# Patient Record
Sex: Female | Born: 1970 | Race: White | Hispanic: No | Marital: Single | Smoking: Never smoker
Health system: Southern US, Community
[De-identification: ages and names within clinical notes are randomized; demographics above are authoritative.]

## PROBLEM LIST (undated history)

## (undated) DIAGNOSIS — Q210 Ventricular septal defect: Secondary | ICD-10-CM

## (undated) DIAGNOSIS — I4891 Unspecified atrial fibrillation: Secondary | ICD-10-CM

## (undated) DIAGNOSIS — I639 Cerebral infarction, unspecified: Secondary | ICD-10-CM

## (undated) DIAGNOSIS — C4490 Unspecified malignant neoplasm of skin, unspecified: Secondary | ICD-10-CM

## (undated) HISTORY — PX: CARDIAC SURGERY: SHX584

## (undated) HISTORY — PX: CHOLECYSTECTOMY: SHX55

## (undated) HISTORY — PX: ABLATION: SHX5711

---

## 2010-12-04 ENCOUNTER — Ambulatory Visit: Payer: Self-pay | Admitting: Internal Medicine

## 2012-12-11 ENCOUNTER — Emergency Department: Payer: Self-pay | Admitting: Emergency Medicine

## 2012-12-11 LAB — COMPREHENSIVE METABOLIC PANEL
Albumin: 3.9 g/dL (ref 3.4–5.0)
Alkaline Phosphatase: 128 U/L (ref 50–136)
Anion Gap: 7 (ref 7–16)
Bilirubin,Total: 0.4 mg/dL (ref 0.2–1.0)
Calcium, Total: 9 mg/dL (ref 8.5–10.1)
Co2: 26 mmol/L (ref 21–32)
EGFR (African American): >60
EGFR (Non-African Amer.): >60
Osmolality: 273 (ref 275–301)
Potassium: 3.7 mmol/L (ref 3.5–5.1)
SGPT (ALT): 29 U/L (ref 12–78)

## 2012-12-11 LAB — URINALYSIS, COMPLETE
Bilirubin,UR: NEGATIVE
Blood: NEGATIVE
Glucose,UR: NEGATIVE mg/dL (ref 0–75)
Leukocyte Esterase: NEGATIVE
Protein: NEGATIVE
Specific Gravity: 1.01 (ref 1.003–1.030)
Squamous Epithelial: 2
WBC UR: 1 /HPF (ref 0–5)

## 2012-12-11 LAB — MAGNESIUM: Magnesium: 1.9 mg/dL

## 2012-12-11 LAB — CBC
HGB: 13.8 g/dL (ref 12.0–16.0)
MCH: 28.8 pg (ref 26.0–34.0)
MCHC: 33.6 g/dL (ref 32.0–36.0)
MCV: 86 fL (ref 80–100)
WBC: 11.1 10*3/uL — ABNORMAL HIGH (ref 3.6–11.0)

## 2016-03-22 ENCOUNTER — Ambulatory Visit
Admission: EM | Admit: 2016-03-22 | Discharge: 2016-03-22 | Disposition: A | Discharge status: Home / Self Care | Payer: BC Managed Care – PPO | Attending: Family Medicine | Admitting: Family Medicine

## 2016-03-22 DIAGNOSIS — K0889 Other specified disorders of teeth and supporting structures: Secondary | ICD-10-CM | POA: Diagnosis not present

## 2016-03-22 HISTORY — DX: Cerebral infarction, unspecified: I63.9

## 2016-03-22 HISTORY — DX: Unspecified atrial fibrillation: I48.91

## 2016-03-22 HISTORY — DX: Unspecified malignant neoplasm of skin, unspecified: C44.90

## 2016-03-22 MED ORDER — HYDROCODONE-ACETAMINOPHEN 5-325 MG PO TABS: 1.0000 | ORAL_TABLET | Freq: Three times a day (TID) | ORAL | Status: DC | PRN

## 2016-03-22 MED ORDER — AMOXICILLIN-POT CLAVULANATE 875-125 MG PO TABS: 1.0000 | ORAL_TABLET | Freq: Two times a day (BID) | ORAL | Status: DC

## 2016-03-22 NOTE — ED Provider Notes (Signed)
CSN: [id number]     Arrival date & time 03/22/16  1343 History   First MD Initiated Contact with Patient 03/22/16 1355     Chief Complaint  Patient presents with  . Dental Problem    Pt reports she broke off a tooth last night upper left mouth.  Pain 3/10. Wants ABX due to being a heart patient.    (Consider location/radiation/quality/duration/timing/severity/associated sxs/prior Treatment) HPI: Patient states that she bit into a piece of gum yesterday and chipped her left upper tooth. She has noticed some swelling and pain increasing over the day. She had open heart surgery as a child and has atrial fibrillation. She is on a blood thinner. She plans to see her dentist early in the week. Patient has taken amoxicillin/Augmentin in the past without any problems. Patient also admits to taking Vicodin in the past without any problems. She states that codeine has in the past made her nauseous.  No past medical history on file. No past surgical history on file. No family history on file. Social History  Substance Use Topics  . Smoking status: Not on file  . Smokeless tobacco: Not on file  . Alcohol Use: Not on file   OB History    No data available     Review of Systems: Negative except mentioned above.   Allergies  Sulfa antibiotics; Ceftazidime; and Codeine  Home Medications   Prior to Admission medications   Medication Sig Start Date End Date Taking? Authorizing Provider  apixaban (ELIQUIS) 5 MG TABS tablet Take 5 mg by mouth 2 (two) times daily.   Yes Historical Provider, MD  furosemide (LASIX) 20 MG tablet Take 20 mg by mouth.   Yes Historical Provider, MD   Meds Ordered and Administered this Visit  Medications - No data to display  BP 103/50 mmHg  Pulse 69  Temp(Src) 98.1 F (36.7 C) (Oral)  Resp 18  Ht 5\' 4"  (1.626 m)  Wt 143 lb (64.864 kg)  BMI 24.53 kg/m2  SpO2 100% No data found.   Physical Exam   GENERAL: NAD HEENT: no pharyngeal erythema, no exudate, small  chip to left upper tooth, no bleeding, mild tenderness along left maxillary area, no significant swelling  RESP: CTA B CARD: RRR NEURO: CN II-XII grossly intact   ED Course  Procedures (including critical care time)  Labs Review Labs Reviewed - No data to display  Imaging Review No results found.     MDM  A/P: Left upper dental pain- will treat with Augmentin, Norco when necessary, soft foods avoid excessive chewing, follow-up with dentist come Monday.   Paulina Fusi, MD 03/22/16 1416

## 2016-05-07 ENCOUNTER — Ambulatory Visit
Admission: EM | Admit: 2016-05-07 | Discharge: 2016-05-07 | Disposition: A | Discharge status: Home / Self Care | Payer: BC Managed Care – PPO | Attending: Emergency Medicine | Admitting: Emergency Medicine

## 2016-05-07 ENCOUNTER — Encounter: Payer: Self-pay | Admitting: Emergency Medicine

## 2016-05-07 DIAGNOSIS — S01512A Laceration without foreign body of oral cavity, initial encounter: Secondary | ICD-10-CM | POA: Diagnosis not present

## 2016-05-07 HISTORY — DX: Ventricular septal defect: Q21.0

## 2016-05-07 MED ORDER — AMOXICILLIN 875 MG PO TABS: 875.0000 mg | ORAL_TABLET | Freq: Two times a day (BID) | ORAL | 0 refills | Status: AC

## 2016-05-07 NOTE — ED Triage Notes (Signed)
Pt was eating and felt something in her mouth and then was bleeding and now maybe a clot in her mouth. She is a heart patient and concerned, pt on blood thinner.

## 2016-05-07 NOTE — Discharge Instructions (Signed)
Avoid hard crunchy foods; listerine mouthwash TID after meals;

## 2016-05-07 NOTE — ED Provider Notes (Signed)
CSN: [id number]     Arrival date & time 05/07/16  1834 History   None    Chief Complaint  Patient presents with  . Laceration   (Consider location/radiation/quality/duration/timing/severity/associated sxs/prior Treatment) Married caucasian female here for evaluation of bleeding in mouth started at restaurant eating pizza and having a beer.  Denied trauma.  Stated crust hard on pizza and she is on a blood thinner for a heart condition.  Couldn't get bleeding under control at restaurant so came here for evaluation.  Concerned it is a blood clot had a stroke one year ago.      Past Medical History:  Diagnosis Date  . Atrial fibrillation ([place])   . CVA (cerebral infarction)   . Skin cancer   . VSD (ventricular septal defect)    Past Surgical History:  Procedure Laterality Date  . ABLATION    . CARDIAC SURGERY    . CHOLECYSTECTOMY     History reviewed. No pertinent family history. Social History  Substance Use Topics  . Smoking status: Never Smoker  . Smokeless tobacco: Never Used  . Alcohol use No   OB History    No data available     Review of Systems  Constitutional: Negative for chills and fever.  HENT: Negative for congestion, ear pain and sore throat.   Eyes: Negative for pain and discharge.  Respiratory: Negative for cough and wheezing.   Cardiovascular: Negative for chest pain and leg swelling.  Gastrointestinal: Negative for blood in stool, constipation, diarrhea, nausea and vomiting.  Genitourinary: Negative for difficulty urinating, dysuria and hematuria.  Musculoskeletal: Negative for arthralgias, back pain and myalgias.  Skin: Positive for wound. Negative for color change, pallor and rash.  Allergic/Immunologic: Negative for environmental allergies and food allergies.  Neurological: Negative for headaches.  Hematological: Negative for adenopathy. Does not bruise/bleed easily.  Psychiatric/Behavioral: Negative for agitation and confusion. The patient is not  nervous/anxious.     Allergies  Sulfa antibiotics; Ceftazidime; and Codeine  Home Medications   Prior to Admission medications   Medication Sig Start Date End Date Taking? Authorizing Provider  metoprolol tartrate (LOPRESSOR) 25 MG tablet Take 25 mg by mouth as needed (for heart palpitations).   Yes Historical Provider, MD  amoxicillin (AMOXIL) 875 MG tablet Take 1 tablet (875 mg total) by mouth 2 (two) times daily. 05/07/16 05/14/16  Olen Cordial, NP  apixaban (ELIQUIS) 5 MG TABS tablet Take 5 mg by mouth 2 (two) times daily.    Historical Provider, MD  furosemide (LASIX) 20 MG tablet Take 20 mg by mouth as needed.     Historical Provider, MD   Meds Ordered and Administered this Visit  Medications - No data to display  BP (!) 108/57 (BP Location: Left Arm)   Pulse 77   Temp 98.1 F (36.7 C) (Oral)   Resp 16   Ht 5\' 4"  (1.626 m)   Wt 144 lb (65.3 kg)   LMP 03/07/2014   SpO2 100%   BMI 24.72 kg/m  No data found.   Physical Exam  Constitutional: She is oriented to person, place, and time. Vital signs are normal. She appears well-developed and well-nourished. She is cooperative.  Non-toxic appearance. She does not have a sickly appearance. She does not appear ill. No distress.  HENT:  Head: Normocephalic and atraumatic.  Right Ear: Hearing and external ear normal.  Left Ear: Hearing and external ear normal.  Nose: Nose normal.  Mouth/Throat: Uvula is midline and oropharynx is clear and moist.  Mucous membranes are not pale, not dry and not cyanotic. She does not have dentures. No oral lesions. No trismus in the jaw. Normal dentition. Lacerations present. No dental abscesses, uvula swelling or dental caries. No oropharyngeal exudate, posterior oropharyngeal edema, posterior oropharyngeal erythema or tonsillar abscesses. Tonsils are 0 on the right. Tonsils are 0 on the left. No tonsillar exudate.    Eyes: Conjunctivae, EOM and lids are normal. Pupils are equal, round, and  reactive to light. Right eye exhibits no discharge. Left eye exhibits no discharge. No scleral icterus.  Neck: Trachea normal and normal range of motion. Neck supple.  Cardiovascular: Normal rate, regular rhythm, normal heart sounds and intact distal pulses.   No murmur heard. Pulmonary/Chest: Effort normal and breath sounds normal. No respiratory distress.  Abdominal: Soft. Bowel sounds are normal. There is no tenderness.  Musculoskeletal: Normal range of motion. She exhibits no edema, tenderness or deformity.  Lymphadenopathy:    She has no cervical adenopathy.  Neurological: She is alert and oriented to person, place, and time. She displays normal reflexes. She exhibits normal muscle tone. Coordination normal.  Skin: Skin is warm, dry and intact. Capillary refill takes less than 2 seconds. No rash noted. No erythema. No pallor.  Psychiatric: She has a normal mood and affect. Her speech is normal and behavior is normal. Judgment and thought content normal. Cognition and memory are normal.  Nursing note and vitals reviewed.   Urgent Care Course   Clinical Course    Procedures (including critical care time)  Labs Review Labs Reviewed - No data to display  Imaging Review No results found.     MDM   1. Laceration of buccal mucosa without complication, initial encounter    Soft diet until affected area healed avoid crispy/crunchy/hard foods discussed pasta/eggs/mashed potatoes/ice cream/pudding/jello.  If bleeding restarts direct pressure/ice to area and if no resolution in 15 minutes or less and/or hematoma area expands to diameter greater than 3cm/dysphagia/dyspnea/dysphasia seek care again PCM/[facility] or ER of choice for re-evaluation.  Good oral hygiene to prevent infection in lacerated area right buccal area.  Rx given amoxicillin 875mg  po BID x 7 days if signs of infection noted hot swelling/edema/erythema/fever/pain noted.  Follow up with PCM/dentist if no improvement or worsening  of symptoms e.g. fever, chills, dyspnea, SOB, purulent discharge, neck pain, eye pain.  Patient verbalized understanding of information/instructions agreed with plan of care and had no further questions at this time.      Olen Cordial, NP 05/07/16 2044

## 2017-10-06 ENCOUNTER — Ambulatory Visit
Admission: EM | Admit: 2017-10-06 | Discharge: 2017-10-06 | Disposition: A | Discharge status: Home / Self Care | Payer: BC Managed Care – PPO | Attending: Family Medicine | Admitting: Family Medicine

## 2017-10-06 ENCOUNTER — Other Ambulatory Visit: Payer: Self-pay

## 2017-10-06 DIAGNOSIS — J0100 Acute maxillary sinusitis, unspecified: Secondary | ICD-10-CM | POA: Diagnosis not present

## 2017-10-06 MED ORDER — AMOXICILLIN-POT CLAVULANATE 875-125 MG PO TABS: 1.0000 | ORAL_TABLET | Freq: Two times a day (BID) | ORAL | 0 refills | Status: DC

## 2017-10-06 NOTE — ED Provider Notes (Addendum)
[facility]    CSN: [id number] Arrival date & time: 10/06/17  0858     History   Chief Complaint Chief Complaint  Patient presents with  . Sinusitis    HPI Kaitlyn Figueroa is a 47 y.o. female presented to clinic with CC of facial pain and pressure, purulent nasal drainage x 2 weeks.  Denies fever/chills.  HPI  Past Medical History:  Diagnosis Date  . Atrial fibrillation ([place])   . CVA (cerebral infarction)   . Skin cancer   . VSD (ventricular septal defect)     There are no active problems to display for this patient.   Past Surgical History:  Procedure Laterality Date  . ABLATION    . CARDIAC SURGERY    . CHOLECYSTECTOMY      OB History    No data available       Home Medications    Prior to Admission medications   Medication Sig Start Date End Date Taking? Authorizing Provider  apixaban (ELIQUIS) 5 MG TABS tablet Take 5 mg by mouth 2 (two) times daily.   Yes [provider]  furosemide (LASIX) 20 MG tablet Take 20 mg by mouth as needed.    Yes [provider]  metoprolol tartrate (LOPRESSOR) 25 MG tablet Take 25 mg by mouth as needed (for heart palpitations).   Yes [provider]  amoxicillin-clavulanate (AUGMENTIN) 875-125 MG tablet Take 1 tablet by mouth every 12 (twelve) hours. 10/06/17   Nick Stults, NP    Family History Family History  Problem Relation Age of Onset  . Heart disease Mother   . Atrial fibrillation Mother   . Leukemia Father   . COPD Father     Social History Social History   Tobacco Use  . Smoking status: Never Smoker  . Smokeless tobacco: Never Used  Substance Use Topics  . Alcohol use: No  . Drug use: No     Allergies   Sulfa antibiotics; Ceftazidime; and Codeine   Review of Systems Review of Systems  Constitutional: Negative.   HENT: Positive for congestion, postnasal drip, sinus pressure and sinus pain.   Cardiovascular: Negative.      Physical Exam Triage Vital  Signs ED Triage Vitals  Enc Vitals Group     BP 10/06/17 0917 (!) 105/47     Pulse Rate 10/06/17 0917 68     Resp 10/06/17 0917 17     Temp 10/06/17 0917 97.8 F (36.6 C)     Temp Source 10/06/17 0917 Oral     SpO2 10/06/17 0917 99 %     Weight 10/06/17 0914 158 lb (71.7 kg)     Height 10/06/17 0914 5\' 4"  (1.626 m)     Head Circumference --      Peak Flow --      Pain Score 10/06/17 0914 2     Pain Loc --      Pain Edu? --      Excl. in [place]? --    No data found.  Updated Vital Signs BP (!) 105/47 (BP Location: Left Arm)   Pulse 68   Temp 97.8 F (36.6 C) (Oral)   Resp 17   Ht 5\' 4"  (1.626 m)   Wt 158 lb (71.7 kg)   LMP 11/22/2014   SpO2 99%   BMI 27.12 kg/m   Visual Acuity Right Eye Distance:   Left Eye Distance:   Bilateral Distance:    Right Eye Near:   Left Eye Near:  Bilateral Near:     Physical Exam  Constitutional: She is oriented to person, place, and time. She appears well-developed and well-nourished.  HENT:  Nose: Sinus tenderness (Maxillary sinus tenderness to palpation) present. Right sinus exhibits maxillary sinus tenderness. Left sinus exhibits maxillary sinus tenderness.  Eyes: Pupils are equal, round, and reactive to light.  Cardiovascular: Normal rate, regular rhythm and normal heart sounds.  Pulmonary/Chest: Effort normal and breath sounds normal. No respiratory distress. She has no wheezes.  Neurological: She is alert and oriented to person, place, and time.  Skin: Skin is warm.     UC Treatments / Results  Labs (all labs ordered are listed, but only abnormal results are displayed) Labs Reviewed - No data to display  EKG  EKG Interpretation None       Radiology No results found.  Procedures Procedures (including critical care time)  Medications Ordered in UC Medications - No data to display   Initial Impression / Assessment and Plan / UC Course  I have reviewed the triage vital signs and the nursing notes.  Pertinent  labs & imaging results that were available during my care of the patient were reviewed by me and considered in my medical decision making (see chart for details).    Saline nasal rinse as advised  Drug Allergies verified with patient. Pt have taken Augmentin previously without any adverse reaction.  Final Clinical Impressions(s) / UC Diagnoses   Final diagnoses:  Acute maxillary sinusitis, recurrence not specified    ED Discharge Orders        Ordered    amoxicillin-clavulanate (AUGMENTIN) 875-125 MG tablet  Every 12 hours     10/06/17 0946       Controlled Substance Prescriptions [place] Controlled Substance Registry consulted? Not Applicable   Teola Bradley, NP 10/06/17 Hunter, Burns, NP 10/06/17 [phone]

## 2017-10-06 NOTE — ED Triage Notes (Signed)
Patient complains of cough, congestion, sinus pain and pressure x 2 weeks. Patient states that she is currently a heart patient and is not able to take OTC medication. Patient states that she has noticed some labored breathing at times.

## 2017-10-06 NOTE — Discharge Instructions (Signed)
Saline nasal rinse as advised. Tylenol as needed for pain.

## 2018-07-25 ENCOUNTER — Ambulatory Visit
Admission: EM | Admit: 2018-07-25 | Discharge: 2018-07-25 | Disposition: A | Discharge status: Home / Self Care | Payer: BC Managed Care – PPO | Attending: Emergency Medicine | Admitting: Emergency Medicine

## 2018-07-25 DIAGNOSIS — T7840XA Allergy, unspecified, initial encounter: Secondary | ICD-10-CM | POA: Diagnosis not present

## 2018-07-25 MED ORDER — ONDANSETRON 8 MG PO TBDP: 8.0000 mg | ORAL_TABLET | Freq: Two times a day (BID) | ORAL | 0 refills | Status: DC

## 2018-07-25 NOTE — ED Provider Notes (Signed)
[facility]    CSN: [id number] Arrival date & time: 07/25/18  1223     History   Chief Complaint Chief Complaint  Patient presents with  . Allergic Reaction    HPI Elira Colasanti is a 47 y.o. female.   HPI  47 year old female presents stating that on Wednesday 4 days prior to this visit her flu shot in her left upper arm.  Shortly afterwards she started having tingling in her arm swelled up.  States that she had small whelps here on her body.  Since then she is been having nausea diarrhea and a feeling of her body aches and pains in her legs.  Has taken 1 g of Tylenol without relief.  That yesterday the diarrhea was worse than today it is much better today but still does not have complete form.  Is any fever or chills.  She has not had any further whelps.  She has no itching.  Had any respiratory symptoms.  She is able to tolerate food and liquids but does not have much of an appetite.     Past Medical History:  Diagnosis Date  . Atrial fibrillation ([place])   . CVA (cerebral infarction)   . Skin cancer   . VSD (ventricular septal defect)     There are no active problems to display for this patient.   Past Surgical History:  Procedure Laterality Date  . ABLATION    . CARDIAC SURGERY    . CHOLECYSTECTOMY      OB History   None      Home Medications    Prior to Admission medications   Medication Sig Start Date End Date Taking? Authorizing Provider  apixaban (ELIQUIS) 5 MG TABS tablet Take 5 mg by mouth 2 (two) times daily.   Yes [provider]  furosemide (LASIX) 20 MG tablet Take 20 mg by mouth as needed.    Yes [provider]  metoprolol tartrate (LOPRESSOR) 25 MG tablet Take 25 mg by mouth as needed (for heart palpitations).   Yes [provider]  amoxicillin-clavulanate (AUGMENTIN) 875-125 MG tablet Take 1 tablet by mouth every 12 (twelve) hours. 10/06/17   Multani, Bhupinder, NP  ondansetron (ZOFRAN ODT) 8 MG  disintegrating tablet Take 1 tablet (8 mg total) by mouth 2 (two) times daily. 07/25/18   Lorin Picket, PA-C    Family History Family History  Problem Relation Age of Onset  . Heart disease Mother   . Atrial fibrillation Mother   . Leukemia Father   . COPD Father     Social History Social History   Tobacco Use  . Smoking status: Never Smoker  . Smokeless tobacco: Never Used  Substance Use Topics  . Alcohol use: No  . Drug use: No     Allergies   Sulfa antibiotics; Ceftazidime; and Codeine   Review of Systems Review of Systems  Constitutional: Positive for activity change. Negative for appetite change, chills, fatigue and fever.  Gastrointestinal: Positive for diarrhea and nausea. Negative for abdominal distention, abdominal pain, blood in stool, constipation and vomiting.  Skin: Positive for color change.  All other systems reviewed and are negative.    Physical Exam Triage Vital Signs ED Triage Vitals  Enc Vitals Group     BP 07/25/18 1244 99/65     Pulse Rate 07/25/18 1244 82     Resp 07/25/18 1244 18     Temp 07/25/18 1244 98.5 F (36.9 C)     Temp Source  07/25/18 1244 Oral     SpO2 07/25/18 1244 99 %     Weight 07/25/18 1247 160 lb (72.6 kg)     Height --      Head Circumference --      Peak Flow --      Pain Score 07/25/18 1246 2     Pain Loc --      Pain Edu? --      Excl. in [place]? --    No data found.  Updated Vital Signs BP 99/65 (BP Location: Right Arm)   Pulse 82   Temp 98.5 F (36.9 C) (Oral)   Resp 18   Wt 160 lb (72.6 kg)   LMP 11/22/2014   SpO2 99%   BMI 27.46 kg/m   Visual Acuity Right Eye Distance:   Left Eye Distance:   Bilateral Distance:    Right Eye Near:   Left Eye Near:    Bilateral Near:     Physical Exam  Constitutional: She is oriented to person, place, and time. She appears well-developed and well-nourished. No distress.  HENT:  Head: Normocephalic.  Eyes: Pupils are equal, round, and reactive to light.  Right eye exhibits no discharge. Left eye exhibits no discharge.  Neck: Normal range of motion.  Abdominal: Soft. Bowel sounds are normal. She exhibits no distension. There is no tenderness. There is no rebound.  Musculoskeletal: Normal range of motion.  Neurological: She is alert and oriented to person, place, and time.  Skin: Skin is warm and dry. She is not diaphoretic.  There is a bruise that is resolving inferior to the deltoid on the lateral posterior surface of the left upper arm.  Psychiatric: She has a normal mood and affect. Her behavior is normal. Judgment and thought content normal.  Nursing note and vitals reviewed.    UC Treatments / Results  Labs (all labs ordered are listed, but only abnormal results are displayed) Labs Reviewed - No data to display  EKG None  Radiology No results found.  Procedures Procedures (including critical care time)  Medications Ordered in UC Medications - No data to display  Initial Impression / Assessment and Plan / UC Course  I have reviewed the triage vital signs and the nursing notes.  Pertinent labs & imaging results that were available during my care of the patient were reviewed by me and considered in my medical decision making (see chart for details).   Discussion with the patient regarding her symptoms.  She likely had a mild reaction to the influenza injection locally and perhaps systemically.  Reassured her that this seemed  extremely mild and resolving.  The diarrhea seems to be lessening.  He  has nausea and I will prescribe Zofran for that to help her.  He can apply heat to the bruised area on her arm from the injection.  Otherwise I expect her to improve. If She does not she may return to our clinic or go to a primary care physician.     Final Clinical Impressions(s) / UC Diagnoses   Final diagnoses:  Allergic reaction to drug, initial encounter     Discharge Instructions     May apply heat to the injection area  to help with the bruising.  Continue taking Tylenol 1 g every 6 hours as necessary for pain /fatigue.  Do not take more than 4 g/day.  You are not improving follow-up with a primary care physician   ED Prescriptions    Medication Sig Dispense  Auth. Provider   ondansetron (ZOFRAN ODT) 8 MG disintegrating tablet Take 1 tablet (8 mg total) by mouth 2 (two) times daily. 6 tablet Lorin Picket, PA-C     Controlled Substance Prescriptions [place] Controlled Substance Registry consulted? Not Applicable   Lorin Picket, PA-C 07/25/18 1414

## 2018-07-25 NOTE — Discharge Instructions (Signed)
May apply heat to the injection area to help with the bruising.  Continue taking Tylenol 1 g every 6 hours as necessary for pain /fatigue.  Do not take more than 4 g/day.  You are not improving follow-up with a primary care physician

## 2018-07-25 NOTE — ED Triage Notes (Signed)
Pt states she had a flu shot on Wednesday and states she had a allergic reaction where her lips started tingling and arm swelled up. But states since then she has been having nausea, diarrhea, feeling bad and body aches/pain in her legs. Did take tylenol without relief.

## 2018-10-07 ENCOUNTER — Ambulatory Visit
Admission: EM | Admit: 2018-10-07 | Discharge: 2018-10-07 | Disposition: A | Discharge status: Home / Self Care | Payer: BC Managed Care – PPO | Attending: Family Medicine | Admitting: Family Medicine

## 2018-10-07 ENCOUNTER — Other Ambulatory Visit: Payer: Self-pay

## 2018-10-07 DIAGNOSIS — H539 Unspecified visual disturbance: Secondary | ICD-10-CM | POA: Diagnosis not present

## 2018-10-07 NOTE — ED Triage Notes (Signed)
Pt states 20 minutes ago she lost peripheral vision in right eye. Now seeing "bright dots" in left eye.

## 2018-10-07 NOTE — ED Provider Notes (Signed)
[facility]    CSN: [id number] Arrival date & time: 10/07/18  1802  History   Chief Complaint Chief Complaint  Patient presents with  . Loss of Vision   HPI  48 year old female presents with changes.  Patient reports that approximate 20 minutes prior to arrival she developed blurry vision, flashes, and floaters.  Started in the right eye.  Has now developed in the left eye as well.  Patient states that she currently feels anxious.  No weakness, paresthesias.  Patient has a history of TIA and stroke.  She is currently on Eliquis.  She endorses compliance.  No reports of facial droop.  No speech difficulty.  Patient feels like her vision is worsening.  No other associated symptoms.  No other complaints or concerns at this time.  PMH, Surgical Hx, Family Hx, Social History reviewed and updated as below.  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  Congenital heart disease  s/p ASD repair age 67 with residual small perimenbranous VSD  VSD (ventricular septal defect), perimembranous    S/P right heart catheterization 2010 normal filling pressures, normal cardiac output, no evidence of intracardiac shunt  Personal history of prior ablation treatment 2008 AVNRT ablation  History of atrial fibrillation    S/P radiofrequency ablation operation for arrhythmia 2015   Abnormal mammogram    Anxiety  Was on Citalopram for a time  Cancer (CMS-HCC) SKIN R ARM  Heart murmur 1992   Stroke (CMS-HCC) 2016 cerebral event  Bilateral carpal tunnel syndrome    Low back strain    Personal history of surgery to heart and great vessels, presenting hazards to health 02/16/2014   History of atrial septal defect repair 08/22/2015   Herpes zoster without complication 9/70/2637    Surgical Hx: ASD REPAIR 10/06/1990 - 10/06/1991  age 53   [place] 10/06/2006 - 10/06/2007     BREAST BIOPSY  10/06/2005 - 10/05/2006 Left benign   PR EPHYS EVAL W/ ABLATION SUPRAVENT ARRHYTHMIA 11/15/2015 N/A Procedure: A-Flutter Ablation; Surgeon: Michelle Piper, MD; Location: [facility] EP; Service: Cardiology     Home Medications    Prior to Admission medications   Medication Sig Start Date End Date Taking? Authorizing Provider  apixaban (ELIQUIS) 5 MG TABS tablet Take 5 mg by mouth 2 (two) times daily.   Yes [provider]  furosemide (LASIX) 20 MG tablet Take 20 mg by mouth as needed.    Yes [provider]  metoprolol tartrate (LOPRESSOR) 25 MG tablet Take 25 mg by mouth as needed (for heart palpitations).   Yes [provider]  ondansetron (ZOFRAN ODT) 8 MG disintegrating tablet Take 1 tablet (8 mg total) by mouth 2 (two) times daily. 07/25/18   Lorin Picket, PA-C    Family History Family History  Problem Relation Age of Onset  . Heart disease Mother   . Atrial fibrillation Mother   . Leukemia Father   . COPD Father     Social History Social History   Tobacco Use  . Smoking status: Never Smoker  . Smokeless tobacco: Never Used  Substance Use Topics  . Alcohol use: No  . Drug use: No     Allergies   Sulfa antibiotics; Ceftazidime; and Codeine   Review of Systems Review of Systems  Eyes: Positive for visual disturbance.  Neurological: Negative.    Physical Exam Triage Vital Signs ED Triage Vitals [10/07/18 1810]  Enc  Vitals Group     BP 136/76     Pulse Rate 79     Resp      Temp 97.6 F (36.4 C)     Temp src      SpO2 100 %     Weight 160 lb (72.6 kg)     Height 5\' 4"  (1.626 m)     Head Circumference      Peak Flow      Pain Score 0     Pain Loc      Pain Edu?      Excl. in [place]?    Updated Vital Signs BP 136/76   Pulse 79   Temp 97.6 F (36.4 C)   Ht 5\' 4"  (1.626 m)   Wt 72.6 kg   LMP 11/22/2014   SpO2 100%   BMI 27.46 kg/m   Visual Acuity Right Eye Distance:   Left Eye Distance:   Bilateral Distance:    Right  Eye Near:   Left Eye Near:    Bilateral Near:     Physical Exam Vitals signs and nursing note reviewed.  Constitutional:      General: She is not in acute distress. HENT:     Head: Normocephalic and atraumatic.     Nose: Nose normal.  Eyes:     Extraocular Movements: Extraocular movements intact.     Conjunctiva/sclera: Conjunctivae normal.     Pupils: Pupils are equal, round, and reactive to light.  Cardiovascular:     Rate and Rhythm: Normal rate and regular rhythm.  Pulmonary:     Effort: Pulmonary effort is normal.     Breath sounds: Normal breath sounds.  Neurological:     Mental Status: She is alert and oriented to person, place, and time.     Comments: No apparent cranial nerve deficits.  Normal muscle strength throughout.  Psychiatric:        Mood and Affect: Mood normal.        Behavior: Behavior normal.    UC Treatments / Results  Labs (all labs ordered are listed, but only abnormal results are displayed) Labs Reviewed - No data to display  EKG None  Radiology No results found.  Procedures Procedures (including critical care time)  Medications Ordered in UC Medications - No data to display  Initial Impression / Assessment and Plan / UC Course  I have reviewed the triage vital signs and the nursing notes.  Pertinent labs & imaging results that were available during my care of the patient were reviewed by me and considered in my medical decision making (see chart for details).    48 year old female presents with flashes, floaters, and blurry vision.  Patient has a history of TIA as well as stroke.  Advised the patient that she needs to go to the hospital for further evaluation and management.  Patient elected to go via EMS.  Final Clinical Impressions(s) / UC Diagnoses   Final diagnoses:  Visual disturbance   Discharge Instructions   None    ED Prescriptions    None     Controlled Substance Prescriptions [place] Controlled Substance Registry  consulted? Not Applicable   Coral Spikes, [place] 10/07/18 Valerie Roys

## 2019-02-05 ENCOUNTER — Ambulatory Visit
Admission: EM | Admit: 2019-02-05 | Discharge: 2019-02-05 | Disposition: A | Discharge status: Nursing Home (Includes ICF) | Payer: BC Managed Care – PPO | Attending: Family Medicine | Admitting: Family Medicine

## 2019-02-05 DIAGNOSIS — R51 Headache: Secondary | ICD-10-CM | POA: Diagnosis not present

## 2019-02-05 DIAGNOSIS — R202 Paresthesia of skin: Secondary | ICD-10-CM | POA: Insufficient documentation

## 2019-02-05 DIAGNOSIS — R519 Headache, unspecified: Secondary | ICD-10-CM

## 2019-02-05 NOTE — ED Provider Notes (Signed)
[facility] ____________________________________________  Time seen: Approximately 12:13 PM  I have reviewed the triage vital signs and the nursing notes.   HISTORY  Chief Complaint Neck Pain   HPI Kaitlyn Figueroa is a 48 y.o. female presenting for evaluation of left arm heaviness and sensation changes.  Also reports left-sided headache.  Patient reports last night while she was sitting on her couch eating ice cream she had sudden onset of entire left arm heaviness and numbness sensation with coldness to her left hand.  States the left arm was aching pain.  States a heating pad did help the area somewhat.  Tylenol did not improve.  Reports she has had intermittent headaches for the last week to the left side, but reports since this morning her left-sided headache has been increased, currently mild to moderate.  States left-sided headache is behind her left eye and describes as a "shoot straight through ".  Denies vision changes.  States she does have some pain in her left shoulder as well that she feels with movement.  Denies decreased range of motion.  Denies history of same.  Reports legs and right arm without changes.  Denies other paresthesias, unsteady gait, dizziness, syncope or near syncope.  Denies chest pain, shortness of breath or palpitations.  History of paroxysmal A. fib, CVA, ASD repair and VSD currently on Eliquis.  Nuys recent sickness or other recent changes.   Past Medical History:  Diagnosis Date  . Atrial fibrillation ([place])   . CVA (cerebral infarction)   . Skin cancer   . VSD (ventricular septal defect)     There are no active problems to display for this patient.   Past Surgical History:  Procedure Laterality Date  . ABLATION    . CARDIAC SURGERY    . CHOLECYSTECTOMY       No current facility-administered medications for this encounter.   Current Outpatient Medications:  .  apixaban (ELIQUIS) 5 MG TABS tablet, Take 5 mg by mouth 2 (two) times  daily., Disp: , Rfl:  .  furosemide (LASIX) 20 MG tablet, Take 20 mg by mouth as needed. , Disp: , Rfl:  .  metoprolol tartrate (LOPRESSOR) 25 MG tablet, Take 25 mg by mouth as needed (for heart palpitations)., Disp: , Rfl:  .  ondansetron (ZOFRAN ODT) 8 MG disintegrating tablet, Take 1 tablet (8 mg total) by mouth 2 (two) times daily., Disp: 6 tablet, Rfl: 0  Allergies Sulfa antibiotics; Ceftazidime; and Codeine  Family History  Problem Relation Age of Onset  . Heart disease Mother   . Atrial fibrillation Mother   . Leukemia Father   . COPD Father     Social History Social History   Tobacco Use  . Smoking status: Never Smoker  . Smokeless tobacco: Never Used  Substance Use Topics  . Alcohol use: No  . Drug use: No    Review of Systems Constitutional: No fever/chills Eyes: No visual changes. ENT: No sore throat. Some post nasal drainage.  Cardiovascular: Denies chest pain. Respiratory: Denies shortness of breath. Musculoskeletal: Negative for back pain. Skin: Negative for rash. Neurological: As above.  ____________________________________________   PHYSICAL EXAM:  VITAL SIGNS: ED Triage Vitals  Enc Vitals Group     BP 02/05/19 1105 (!) 109/54     Pulse Rate 02/05/19 1105 67     Resp 02/05/19 1105 18     Temp 02/05/19 1105 98.1 F (36.7 C)     Temp Source 02/05/19 1105 Oral  SpO2 02/05/19 1105 100 %     Weight 02/05/19 1108 166 lb (75.3 kg)     Height 02/05/19 1108 5\' 4"  (1.626 m)     Head Circumference --      Peak Flow --      Pain Score 02/05/19 1108 2     Pain Loc --      Pain Edu? --      Excl. in [place]? --     Constitutional: Alert and oriented. Well appearing and in no acute distress. Eyes: Conjunctivae are normal. PERRL. EOMI. ENT      Head: Normocephalic and atraumatic.  Mild bilateral maxillary sinus tenderness palpation.      Nose: No congestion/rhinnorhea.      Mouth/Throat: Mucous membranes are moist.Oropharynx non-erythematous. Neck: No  stridor. Supple without meningismus.  Hematological/Lymphatic/Immunilogical: No cervical lymphadenopathy. Cardiovascular: Normal rate, regular rhythm. Grossly normal heart sounds.  Good peripheral circulation. Respiratory: Normal respiratory effort without tachypnea nor retractions. Breath sounds are clear and equal bilaterally. No wheezes, rales, rhonchi. Musculoskeletal: Steady gait.  Bilateral hand grip strong and equal.  Bilateral distal radial pulses equal.  Left trapezius mild tenderness to direct palpation.  Left arm with full range of motion. Neurologic:  Normal speech and language.  No ataxia.  Speech is normal. No gait instability.  No facial asymmetry.  Left upper extremity a few discrepancies in sharp and dull touch but overall sensation intact. Skin:  Skin is warm, dry and intact. No rash noted. Psychiatric: Mood and affect are normal. Speech and behavior are normal. Patient exhibits appropriate insight and judgment   ___________________________________________   LABS (all labs ordered are listed, but only abnormal results are displayed)  Labs Reviewed - No data to display ____________________________________________  EKG  ED ECG REPORT I, Marylene Land, the attending provider and Dr Zenda Alpers, personally viewed and interpreted this ECG.   Date: 02/05/2019  EKG Time: 1138  Rate: 66  Rhythm: Sinus rhythm with first-degree AV block  Axis: normal  Intervals:first-degree A-V block   ST&T Change: no elevation noted.  Similar to ecg 12/04/10   RADIOLOGY  No results found. ____________________________________________   PROCEDURES Procedures     INITIAL IMPRESSION / ASSESSMENT AND PLAN / ED COURSE  Pertinent labs & imaging results that were available during my care of the patient were reviewed by me and considered in my medical decision making (see chart for details).  Patient presenting with left-sided headache and left-sided arm paresthesias and heaviness.   Discussed multiple differentials with patient including TIA, CVA, atypical migraine, musculoskeletal.  Patient with complex medical history including prior CVA.  Recommend further evaluation emergency room at this time, patient agrees to this plan.  Patient verbalized understanding and risk of self transfer to ER, and states that her family will drive her to [facility], declines EMS.  Stable at discharge. ____________________________________________   FINAL CLINICAL IMPRESSION(S) / ED DIAGNOSES  Final diagnoses:  Left-sided headache  Arm paresthesia, left     ED Discharge Orders    None       Note: This dictation was prepared with Dragon dictation along with smaller phrase technology. Any transcriptional errors that result from this process are unintentional.         Marylene Land, NP 02/05/19 1227

## 2019-02-05 NOTE — ED Triage Notes (Signed)
Pt here for neck pain on the left side and numbness and tingling radiating down her left arm into her left hand. States it feels like she pinched a nerve but does have a hx of stroke and is on eliquis. Did take some tylenol and did a heating pad with slight relief. Now reports left hand feeling "cold" and headache behind her left eye and on the left side of her head.

## 2019-02-05 NOTE — Discharge Instructions (Addendum)
Go directly to emergency room as discussed.  °

## 2019-05-20 ENCOUNTER — Encounter: Payer: Self-pay | Admitting: Emergency Medicine

## 2019-05-20 ENCOUNTER — Ambulatory Visit (INDEPENDENT_AMBULATORY_CARE_PROVIDER_SITE_OTHER): Payer: BC Managed Care – PPO

## 2019-05-20 ENCOUNTER — Ambulatory Visit
Admission: EM | Admit: 2019-05-20 | Discharge: 2019-05-20 | Disposition: A | Discharge status: Home / Self Care | Payer: BC Managed Care – PPO | Attending: Family Medicine | Admitting: Family Medicine

## 2019-05-20 ENCOUNTER — Other Ambulatory Visit: Payer: Self-pay

## 2019-05-20 DIAGNOSIS — R079 Chest pain, unspecified: Secondary | ICD-10-CM

## 2019-05-20 DIAGNOSIS — S39012A Strain of muscle, fascia and tendon of lower back, initial encounter: Secondary | ICD-10-CM | POA: Diagnosis not present

## 2019-05-20 DIAGNOSIS — M542 Cervicalgia: Secondary | ICD-10-CM | POA: Diagnosis not present

## 2019-05-20 DIAGNOSIS — S20219A Contusion of unspecified front wall of thorax, initial encounter: Secondary | ICD-10-CM

## 2019-05-20 DIAGNOSIS — M545 Low back pain: Secondary | ICD-10-CM | POA: Diagnosis not present

## 2019-05-20 DIAGNOSIS — M25512 Pain in left shoulder: Secondary | ICD-10-CM

## 2019-05-20 DIAGNOSIS — S161XXA Strain of muscle, fascia and tendon at neck level, initial encounter: Secondary | ICD-10-CM

## 2019-05-20 MED ORDER — CYCLOBENZAPRINE HCL 10 MG PO TABS: 10.0000 mg | ORAL_TABLET | Freq: Every day | ORAL | 0 refills | Status: DC
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
## 2019-05-20 NOTE — Discharge Instructions (Signed)
Rest, ice, heat, tylenol

## 2019-10-19 ENCOUNTER — Other Ambulatory Visit: Payer: Self-pay

## 2019-10-19 ENCOUNTER — Encounter: Payer: Self-pay | Admitting: Emergency Medicine

## 2019-10-19 ENCOUNTER — Ambulatory Visit
Admission: EM | Admit: 2019-10-19 | Discharge: 2019-10-19 | Disposition: A | Discharge status: Home / Self Care | Payer: BC Managed Care – PPO | Attending: Emergency Medicine | Admitting: Emergency Medicine

## 2019-10-19 DIAGNOSIS — R000 Tachycardia, unspecified: Secondary | ICD-10-CM | POA: Diagnosis not present

## 2019-10-19 NOTE — Discharge Instructions (Addendum)
Rest.  Drink plenty of water.  Monitor self closely.  Please follow-up with your cardiologist tomorrow.  If symptoms recur proceed directly to the emergency room tonight.
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
## 2019-10-19 NOTE — ED Triage Notes (Addendum)
Patient has history of a-fib, had atrial septal defect repaired about 30 years ago. Patient states her heart started racing about 45 minutes ago during sexual activity. Denies chest pain, does endorse shortness of breath.

## 2020-04-15 ENCOUNTER — Encounter: Payer: Self-pay | Admitting: Emergency Medicine

## 2020-04-15 ENCOUNTER — Ambulatory Visit
Admission: EM | Admit: 2020-04-15 | Discharge: 2020-04-15 | Disposition: A | Discharge status: Home / Self Care | Payer: BC Managed Care – PPO | Attending: Internal Medicine | Admitting: Internal Medicine

## 2020-04-15 ENCOUNTER — Other Ambulatory Visit: Payer: Self-pay

## 2020-04-15 DIAGNOSIS — M5431 Sciatica, right side: Secondary | ICD-10-CM

## 2020-04-15 MED ORDER — TRAMADOL HCL 50 MG PO TABS: 50.0000 mg | ORAL_TABLET | Freq: Four times a day (QID) | ORAL | 0 refills | Status: AC | PRN

## 2020-04-15 MED ORDER — ACETAMINOPHEN 500 MG PO TABS: 500.0000 mg | ORAL_TABLET | Freq: Four times a day (QID) | ORAL | 0 refills | Status: AC | PRN

## 2020-04-15 MED ORDER — PREDNISONE 20 MG PO TABS: 20.0000 mg | ORAL_TABLET | Freq: Every day | ORAL | 0 refills | Status: AC

## 2020-04-15 NOTE — ED Provider Notes (Signed)
[facility]    CSN: [id number] Arrival date & time: 04/15/20  0801      History   Chief Complaint Chief Complaint  Patient presents with  . Back Pain    HPI Kaitlyn Figueroa is a 49 y.o. female comes to the urgent care with 1 day history of severe right-sided back pain.  Pain started yesterday.  Pain is sharp, severe-currently 10 out of 10.  Pain is aggravated by movement.  Is not relieved by heating pad.  Pain radiates into the right leg.  She denies any numbness, tingling or weakness in the lower extremities.  No trauma or falls.  Patient has a history of ASD associated stroke and is currently on Eliquis.   HPI  Past Medical History:  Diagnosis Date  . Atrial fibrillation ([place])   . CVA (cerebral infarction)   . Skin cancer   . VSD (ventricular septal defect)     There are no problems to display for this patient.   Past Surgical History:  Procedure Laterality Date  . ABLATION    . CARDIAC SURGERY    . CHOLECYSTECTOMY      OB History   No obstetric history on file.      Home Medications    Prior to Admission medications   Medication Sig Start Date End Date Taking? Authorizing Provider  apixaban (ELIQUIS) 5 MG TABS tablet Take 5 mg by mouth 2 (two) times daily.   Yes [provider]  furosemide (LASIX) 20 MG tablet Take 20 mg by mouth as needed.    Yes [provider]  metoprolol tartrate (LOPRESSOR) 25 MG tablet Take 25 mg by mouth as needed (for heart palpitations).   Yes [provider]  acetaminophen (TYLENOL) 500 MG tablet Take 1 tablet (500 mg total) by mouth every 6 (six) hours as needed. 04/15/20   Dalayza Zambrana, Myrene Galas, MD  predniSONE (DELTASONE) 20 MG tablet Take 1 tablet (20 mg total) by mouth daily for 5 days. 04/15/20 04/20/20  LampteyMyrene Galas, MD  traMADol (ULTRAM) 50 MG tablet Take 1 tablet (50 mg total) by mouth every 6 (six) hours as needed for moderate pain. 04/15/20   Derry Arbogast, Myrene Galas, MD    Family  History Family History  Problem Relation Age of Onset  . Heart disease Mother   . Atrial fibrillation Mother   . Leukemia Father   . COPD Father     Social History Social History   Tobacco Use  . Smoking status: Never Smoker  . Smokeless tobacco: Never Used  Vaping Use  . Vaping Use: Never used  Substance Use Topics  . Alcohol use: Not Currently  . Drug use: No     Allergies   Codeine, Influenza vaccines, Iodinated diagnostic agents, Sulfa antibiotics, and Ceftazidime   Review of Systems Review of Systems  Constitutional: Negative.   Gastrointestinal: Negative.   Musculoskeletal: Positive for back pain. Negative for arthralgias, gait problem, joint swelling, neck pain and neck stiffness.  Neurological: Negative for dizziness, weakness, light-headedness, numbness and headaches.     Physical Exam Triage Vital Signs ED Triage Vitals  Enc Vitals Group     BP 04/15/20 0828 (!) 103/57     Pulse Rate 04/15/20 0828 70     Resp 04/15/20 0828 18     Temp 04/15/20 0828 98.1 F (36.7 C)     Temp Source 04/15/20 0828 Oral     SpO2 04/15/20 0828 98 %     Weight 04/15/20  0824 163 lb (73.9 kg)     Height 04/15/20 0824 5\' 5"  (1.651 m)     Head Circumference --      Peak Flow --      Pain Score 04/15/20 0823 7     Pain Loc --      Pain Edu? --      Excl. in [place]? --    No data found.  Updated Vital Signs BP (!) 103/57 (BP Location: Left Arm)   Pulse 70   Temp 98.1 F (36.7 C) (Oral)   Resp 18   Ht 5\' 5"  (1.651 m)   Wt 73.9 kg   LMP 11/22/2014 Comment: denies preg, waiver signed  SpO2 98%   BMI 27.12 kg/m   Visual Acuity Right Eye Distance:   Left Eye Distance:   Bilateral Distance:    Right Eye Near:   Left Eye Near:    Bilateral Near:     Physical Exam   UC Treatments / Results  Labs (all labs ordered are listed, but only abnormal results are displayed) Labs Reviewed - No data to display  EKG   Radiology No results  found.  Procedures Procedures (including critical care time)  Medications Ordered in UC Medications - No data to display  Initial Impression / Assessment and Plan / UC Course  I have reviewed the triage vital signs and the nursing notes.  Pertinent labs & imaging results that were available during my care of the patient were reviewed by me and considered in my medical decision making (see chart for details).     1.  Sciatica of right side: Short course of prednisone Tramadol 50 mg every 6 hours as needed for pain Gentle range of motion exercises Tylenol as needed for pain Return to urgent care if pain worsens. Final Clinical Impressions(s) / UC Diagnoses   Final diagnoses:  Sciatica of right side   Discharge Instructions   None    ED Prescriptions    Medication Sig Dispense Auth. Provider   predniSONE (DELTASONE) 20 MG tablet Take 1 tablet (20 mg total) by mouth daily for 5 days. 5 tablet Kynslei Art, Myrene Galas, MD   traMADol (ULTRAM) 50 MG tablet Take 1 tablet (50 mg total) by mouth every 6 (six) hours as needed for moderate pain. 15 tablet Arjan Strohm, Myrene Galas, MD   acetaminophen (TYLENOL) 500 MG tablet Take 1 tablet (500 mg total) by mouth every 6 (six) hours as needed. 30 tablet Witten Certain, Myrene Galas, MD     I have reviewed the PDMP during this encounter.   Chase Picket, MD 04/15/20 1352

## 2020-04-15 NOTE — ED Triage Notes (Signed)
Patient in today c/o right sided lower back pain since yesterday. Patient states the pain radiates to her right buttock and right leg. Patient denies any injury. Patient states she has taken Tylenol and applied heat to the area without relief.
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
